# Patient Record
Sex: Male | Born: 1975 | Race: Black or African American | Hispanic: No | Marital: Married | Smoking: Never smoker
Health system: Southern US, Community
[De-identification: ages and names within clinical notes are randomized; demographics above are authoritative.]

## PROBLEM LIST (undated history)

## (undated) HISTORY — PX: FOOT SURGERY: SHX648

---

## 2013-12-14 ENCOUNTER — Observation Stay: Payer: Self-pay | Admitting: Podiatry

## 2013-12-14 LAB — BASIC METABOLIC PANEL
Anion Gap: 9 (ref 7–16)
BUN: 15 mg/dL (ref 7–18)
CALCIUM: 8.8 mg/dL (ref 8.5–10.1)
CO2: 28 mmol/L (ref 21–32)
Chloride: 104 mmol/L (ref 98–107)
Creatinine: 1.19 mg/dL (ref 0.60–1.30)
EGFR (African American): >60
EGFR (Non-African Amer.): >60
GLUCOSE: 105 mg/dL — AB (ref 65–99)
Osmolality: 282 (ref 275–301)
POTASSIUM: 3.5 mmol/L (ref 3.5–5.1)
SODIUM: 141 mmol/L (ref 136–145)

## 2013-12-14 LAB — CBC
HCT: 43.1 % (ref 40.0–52.0)
HGB: 14.2 g/dL (ref 13.0–18.0)
MCH: 30.2 pg (ref 26.0–34.0)
MCHC: 32.9 g/dL (ref 32.0–36.0)
MCV: 92 fL (ref 80–100)
Platelet: 261 10*3/uL (ref 150–440)
RBC: 4.69 10*6/uL (ref 4.40–5.90)
RDW: 14.1 % (ref 11.5–14.5)
WBC: 5.8 10*3/uL (ref 3.8–10.6)

## 2014-05-02 NOTE — Op Note (Signed)
PATIENT NAME:  Albert Bruce, Albert Bruce MR#:  [id number] DATE OF BIRTH:  20-Apr-1975  DATE OF PROCEDURE:  12/14/2013  PREOPERATIVE DIAGNOSES:  1.  Open fracture, left great toe, with traumatic amputation.  2.  Open fracture, left 2nd toe, from lawnmower injury.   PROCEDURE:  1.  Amputation of residual left great toe.  2.  Open repair of fracture, left second toe.   SURGEON: Adlean Hardeman A. Ether GriffinsFowler, DPM.   ANESTHESIA: General with local.   HEMOSTASIS: Ankle tourniquet inflated to 250 mmHg for 21 minutes.   COMPLICATIONS: None.   SPECIMEN: Bone from proximal phalanx open fracture left great toe.   OPERATIVE INDICATIONS: This is a 39 year old gentleman who sustained a lawnmower injury to his left foot earlier today. Was seen in the ER and had a traumatic amputation of his left great toe at the interphalangeal joint with severe exposure of bone as well as contamination to the local wound. A guillotine type of amputation had been performed to the great toe and a large laceration of the medial one half of the 2nd toe from the distal phalanx to the proximal phalanx had occurred as well. There was fracturing of the distal phalanx on x-ray. We discussed debridement of this wound with likely removal of bone to assist with wound closure of the left great toe. He has consented to this.   OPERATIVE PROCEDURE: The patient was brought into the OR and placed on the operating table in the supine position. General intubation was administered by the anesthesia team. The left lower extremity was then prepped and draped in the usual sterile fashion. Attention was directed to the left foot where the guillotine type of amputation had been performed. The laceration and amputation occurred from a proximal dorsal to distal plantar type of angulation. The entire distal phalanx had been amputated as well as no cartilage remaining of the proximal phalanx. This was amputated in approximately a 45 degree angle with exposure of the  medullary canal at this time. At first, I debrided the surrounding skin edges with the VersaJet to remove all contamination. The bone was also debrided circumferential around the wound site. At this time, the remainder of the proximal phalanx with the exception of the very base was then removed with a power saw and removed from the surgical field. I was able to perform a further debridement of the skin edges. The edges were coated with methylene blue and all of the wound was debrided with the VersaJet. This was then pulsed lavaged in this region. I was able to create a plantar skin flap to bring up dorsally for later reanastomosis loosely.   The 2nd toe laceration was noted. The laceration occurred just proximal to the 2nd toe nail with exposure of the interphalangeal joint of the distal portion of the toe. This wound was then debrided initially with the VersaJet as well as further with the pulse lavage. There was noted to be a 0.5 cm defect with mild exposure of bone in this region, a 2nd laceration just proximal to this coursing along the head of the proximal phalanx to the middle phalanx region medially. I was able to debride this out with the VersaJet as well as a pulsed lavage. The skin edge was easily coapted and this was loosely reapproximated with two 4-0 nylon sutures. The dorsal portion of the 2nd toe was loosely sutured at the distal aspect to cover the distal phalanx region. There was exposure of the IPJ and middle phalanx where I could  not get skin closure in this area. This will be allowed for further granulation with time. The great toe flap was then reapproximated very loosely dorsally. A 4-0 nylon suture was used. There was gapping of the dorsal portion of the skin flap to allow for drainage to occur. The skin flap was well perfused as the tourniquet was dropped prior to closure. All bleeders were Bovie cauterized at that time. A bulky compressive sterile dressing was loosely fitted around his  left foot. He was taken from the OR to the PACU with all vital signs stable and neurovascular status intact. He will be admitted to the floor for 24 hours of antibiotics. We will monitor him closely. Hopefully, can discharge in the next 24 hours. We will closely monitor him in the outpatient clinic as well.    ____________________________ Argentina Donovan. Ether Griffins, DPM jaf:LT D: 12/14/2013 20:33:02 ET T: 12/14/2013 20:53:39 ET JOB#: [id number]  cc: Jill Alexanders A. Ether Griffins, DPM, <Dictator> Montario Zilka DPM ELECTRONICALLY SIGNED 12/25/2013 13:07

## 2014-05-02 NOTE — Discharge Summary (Signed)
Dates of Admission and Diagnosis:  Date of Admission 14-Dec-2013   Date of Discharge 15-Dec-2013   Admitting Diagnosis Open fracture left great toe and 2nd toe   Final Diagnosis Open fracture left great toe and 2nd toe    Chief Complaint/History of Present Illness Pt seen in ER with open fracture and amputation of left great toe and fractured 2nd toe from lawnmower accident.   Allergies:  No Known Allergies:   Routine Chem:  06-Dec-15 16:03   Glucose, Serum  105  BUN 15  Creatinine (comp) 1.19  Sodium, Serum 141  Potassium, Serum 3.5  Chloride, Serum 104  CO2, Serum 28  Calcium (Total), Serum 8.8  Anion Gap 9  Osmolality (calc) 282  eGFR (African American) >60  eGFR (Non-African American) >60 (eGFR values <62m/min/1.73 m2 may be an indication of chronic kidney disease (CKD). Calculated eGFR, using the MRDR Study equation, is useful in  patients with stable renal function. The eGFR calculation will not be reliable in acutely ill patients when serum creatinine is changing rapidly. It is not useful in patients on dialysis. The eGFR calculation may not be applicable to patients at the low and high extremes of body sizes, pregnant women, and vegetarians.)  Routine Hem:  06-Dec-15 16:03   WBC (CBC) 5.8  RBC (CBC) 4.69  Hemoglobin (CBC) 14.2  Hematocrit (CBC) 43.1  Platelet Count (CBC) 261 (Result(s) reported on 14 Dec 2013 at 05:22PM.)  MCV 92  MCH 30.2  MCHC 32.9  RDW 14.1   Pertinent Past History:  Pertinent Past History Pt denies   Hospital Course:  Hospital Course Underwent debridement and partial great toe amputation and debridement and stabilization of 2nd toe fracture.  On ancef and tobramycin in hospital.  Doing well pod #1.  Afebrile   Condition on Discharge Good   DISCHARGE INSTRUCTIONS HOME MEDS:  Medication Reconciliation: Patient's Home Medications at Discharge:     Medication Instructions  acetaminophen-oxycodone 325 mg-5 mg oral tablet  1  tab(s) orally every 4 to 6 hours, As needed, moderate pain (4-6/10)   keflex 500 mg oral capsule  1 cap(s) orally 4 times a day     Physician's Instructions:  Home Health? No   Diet Regular   Activity Limitations No weight to left foot.  OK for heel touchdown.   Return to Work Not Applicable   Time frame for Follow Up Appointment 1-2 days   Electronic Signatures: FSamara Deist(MD)  (Signed 07-Dec-15 17:49)  Authored: ADMISSION DATE AND DIAGNOSIS, CHIEF COMPLAINT/HPI, Allergies, PERTINENT LABS, PERTINENT PAST HISTORY, HOSPITAL COURSE, DISCHARGE INSTRUCTIONS HOME MEDS, PATIENT INSTRUCTIONS   Last Updated: 07-Dec-15 17:49 by FSamara Deist(MD)

## 2014-05-04 LAB — SURGICAL PATHOLOGY

## 2015-01-04 ENCOUNTER — Emergency Department
Admission: EM | Admit: 2015-01-04 | Discharge: 2015-01-04 | Disposition: A | Discharge status: Home / Self Care | Payer: Federal, State, Local not specified - PPO | Attending: Emergency Medicine | Admitting: Emergency Medicine

## 2015-01-04 ENCOUNTER — Encounter: Payer: Self-pay | Admitting: Emergency Medicine

## 2015-01-04 DIAGNOSIS — H6091 Unspecified otitis externa, right ear: Secondary | ICD-10-CM | POA: Diagnosis not present

## 2015-01-04 DIAGNOSIS — K029 Dental caries, unspecified: Secondary | ICD-10-CM

## 2015-01-04 DIAGNOSIS — K0889 Other specified disorders of teeth and supporting structures: Secondary | ICD-10-CM | POA: Diagnosis not present

## 2015-01-04 DIAGNOSIS — R030 Elevated blood-pressure reading, without diagnosis of hypertension: Secondary | ICD-10-CM | POA: Insufficient documentation

## 2015-01-04 MED ORDER — NEOMYCIN-POLYMYXIN-HC 3.5-10000-1 OT SOLN: 3.0000 [drp] | Freq: Three times a day (TID) | OTIC | Status: AC

## 2015-01-04 MED ORDER — TRAMADOL HCL 50 MG PO TABS: 50.0000 mg | ORAL_TABLET | Freq: Four times a day (QID) | ORAL | Status: AC | PRN

## 2015-01-04 MED ORDER — AMOXICILLIN 500 MG PO CAPS: 500.0000 mg | ORAL_CAPSULE | Freq: Three times a day (TID) | ORAL | Status: AC

## 2015-01-04 MED ORDER — IBUPROFEN 800 MG PO TABS: 800.0000 mg | ORAL_TABLET | Freq: Three times a day (TID) | ORAL | Status: AC | PRN

## 2015-01-04 NOTE — ED Notes (Signed)
Right ear and tooth pain that began a few days ago.

## 2015-01-04 NOTE — Discharge Instructions (Signed)
Dental Caries Dental caries is tooth decay. This decay can cause a hole in teeth (cavity) that can get bigger and deeper over time. HOME CARE  Brush and floss your teeth. Do this at least two times a day.  Use a fluoride toothpaste.  Use a mouth rinse if told by your dentist or doctor.  Eat less sugary and starchy foods. Drink less sugary drinks.  Avoid snacking often on sugary and starchy foods. Avoid sipping often on sugary drinks.  Keep regular checkups and cleanings with your dentist.  Use fluoride supplements if told by your dentist or doctor.  Allow fluoride to be applied to teeth if told by your dentist or doctor.  OPTIONS FOR DENTAL FOLLOW UP CARE  [place] [facility] and [facility] Services - Local Safety Net Dental Clinics TripDoors.com.htm   [facility] [phone])  [facility] [phone])  [place] [phone])  [facility] [phone])  [facility] [phone]) This clinic caters to the indigent population and is on a lottery system. Location: [employer] [facility], [employer], [address] [place] Clinic Hours: Wednesdays from 6pm - 9pm, patients seen by a lottery system. For dates, call or go to [email or web address] Services: Cleanings, fillings and simple extractions. Payment Options: DENTAL WORK IS FREE OF CHARGE. Bring proof of income or support. Best way to get seen: Arrive at 5:15 pm - this is a lottery, NOT first come/first serve, so arriving earlier will not increase your chances of being seen.     [facility] [phone] Select option 1 for emergencies   Location: [facility], [place], [address] [place] Clinic Hours: No walk-ins accepted - call the day before to schedule an appointment. Check in times are 9:30 am and 1:30  pm. Services: Simple extractions, temporary fillings, pulpectomy/pulp debridement, uncomplicated abscess drainage. Payment Options: PAYMENT IS DUE AT THE TIME OF SERVICE.  Fee is usually $100-200, additional surgical procedures (e.g. abscess drainage) may be extra. Cash, checks, Visa/MasterCard accepted.  Can file Medicaid if patient is covered for dental - patient should call case worker to check. No discount for [facility] patients. Best way to get seen: MUST call the day before and get onto the schedule. Can usually be seen the next 1-2 days. No walk-ins accepted.     [facility] [phone]   Location: [facility], [address], [place] Clinic Hours: M, W, Th, F 8am or 1:30pm, Tues 9a or 1:30 - first come/first served. Services: Simple extractions, temporary fillings, uncomplicated abscess drainage.  You do not need to be an [facility] resident. Payment Options: PAYMENT IS DUE AT THE TIME OF SERVICE. Dental insurance, otherwise sliding scale - bring proof of income or support. Depending on income and treatment needed, cost is usually $50-200. Best way to get seen: Arrive early as it is first come/first served.     [facility] [phone]   Location: [address] Clinic Hours: Mon-Thu 8a-5p Services: Most basic dental services including extractions and fillings. Payment Options: PAYMENT IS DUE AT THE TIME OF SERVICE. Sliding scale, up to 50% off - bring proof if income or support. Medicaid with dental option accepted. Best way to get seen: Call to schedule an appointment, can usually be seen within 2 weeks OR they will try to see walk-ins - show up at 8a or 2p (you may have to wait).  [facility] [phone] [place] RESIDENTS ONLY   Location: [facility], [address], [place], [place] [address] Clinic Hours: By appointment  only. Monday - Thursday 8am-5pm, Friday 8am-12pm Services: Cleanings, fillings, extractions. Payment Options: PAYMENT IS DUE AT THE TIME OF SERVICE. Cash, Visa or MasterCard. Sliding scale - $30 minimum per service. Best way to get seen: Come in to office, complete packet and make an appointment - need proof of income or support monies for each household member and proof of [place] residence. Usually takes about a month to get in.     [facility] [phone]   Location: [address] [facility] Hours: Walk-in Urgent Care Dental Services are offered Monday-Friday mornings only. The numbers of emergencies accepted daily is limited to the number of providers available. Maximum 15 - Mondays, Wednesdays & Thursdays Maximum 10 - Tuesdays & Fridays Services: You do not need to be a [place] resident to be seen for a dental emergency. Emergencies are defined as pain, swelling, abnormal bleeding, or dental trauma. Walkins will receive x-rays if needed. NOTE: Dental cleaning is not an emergency. Payment Options: PAYMENT IS DUE AT THE TIME OF SERVICE. Minimum co-pay is $40.00 for uninsured patients. Minimum co-pay is $3.00 for Medicaid with dental coverage. Dental Insurance is accepted and must be presented at time of visit. Medicare does not cover dental. Forms of payment: Cash, credit card, checks. Best way to get seen: If not previously registered with the clinic, walk-in dental registration begins at 7:15 am and is on a first come/first serve basis. If previously registered with the clinic, call to make an appointment.     [facility] [phone] [place] RESIDENTS ONLY   Location: [address], [place], [place] Clinic Hours: Mon-Thu 10a-2p Services: Extractions only! Payment Options: FREE (donations accepted) - bring proof of income or support Best way to get seen: Call and schedule an appointment OR come  at 8am on the 1st Monday of every month (except for holidays) when it is first come/first served.     [facility] [phone]   Location: [address], [place] Clinic Hours: Friday mornings Services, Payment Options, Best way to get seen: Call for info  This information is not intended to replace advice given to you by your health care provider. Make sure you discuss any questions you have with your health care provider.   Document Released: 10/05/2007 Document Revised: 01/16/2014 Document Reviewed: 12/29/2011 Elsevier Interactive Patient Education [employer]2016 Elsevier Inc.

## 2015-01-04 NOTE — ED Provider Notes (Signed)
[facility] Emergency Department Provider Note  ____________________________________________  Time seen: Approximately 9:51 AM  I have reviewed the triage vital signs and the nursing notes.   HISTORY  Chief Complaint Dental Pain and Otalgia    HPI Albert Bruce is a 39 y.o. male patient complaining of right ear and tooth pain for several days. Patient state he saw his dentist approximately 2 weeks ago and was scheduled to see him again for 1 month. Patient states nose swelling around the tooth that the dentist worked on and was prescribed NSAIDs telephonically.  Patient state he noticed he is having decreased hearing and pain to the right ear . No palliative measures for this complaint. Patient rates his pain as 8/10. Describes dental pain is sharp and his ear pain is achy. History reviewed. No pertinent past medical history.  There are no active problems to display for this patient.   Past Surgical History  Procedure Laterality Date  . Foot surgery      No current outpatient prescriptions on file.  Allergies Review of patient's allergies indicates no known allergies.  No family history on file.  Social History Social History  Substance Use Topics  . Smoking status: Never Smoker   . Smokeless tobacco: None  . Alcohol Use: No    Review of Systems Constitutional: No fever/chills Eyes: No visual changes. ENT: No sore throat. Dental and ear pain. Cardiovascular: Denies chest pain. Respiratory: Denies shortness of breath. Gastrointestinal: No abdominal pain.  No nausea, no vomiting.  No diarrhea.  No constipation. Genitourinary: Negative for dysuria. Musculoskeletal: Negative for back pain. Skin: Negative for rash. Neurological: Negative for headaches, focal weakness or numbness. 10-point ROS otherwise negative.  ____________________________________________   PHYSICAL EXAM:  VITAL SIGNS: ED Triage Vitals  Enc Vitals Group     BP  01/04/15 0915 162/96 mmHg     Pulse Rate 01/04/15 0915 64     Resp 01/04/15 0915 18     Temp 01/04/15 0915 98.7 F (37.1 C)     Temp Source 01/04/15 0915 Oral     SpO2 01/04/15 0915 97 %     Weight 01/04/15 0915 256 lb (116.121 kg)     Height 01/04/15 0915 6' (1.829 m)     Head Cir --      Peak Flow --      Pain Score 01/04/15 0919 8     Pain Loc --      Pain Edu? --      Excl. in GC? --     Constitutional: Alert and oriented. Well appearing and in no acute distress. Eyes: Conjunctivae are normal. PERRL. EOMI. Head: Atraumatic. Nose: No congestion/rhinnorhea. Mouth/Throat: Mucous membranes are moist.  Oropharynx non-erythematous. Edematous gingiva right lower molar areas.  EARS: Edematous and erythematous right ear canal. Left ear unremarkable. Neck: No stridor.  No cervical spine tenderness to palpation. Hematological/Lymphatic/Immunilogical: No cervical lymphadenopathy. Cardiovascular: Normal rate, regular rhythm. Grossly normal heart sounds.  Good peripheral circulation. Elevated blood pressure Respiratory: Normal respiratory effort.  No retractions. Lungs CTAB. Gastrointestinal: Soft and nontender. No distention. No abdominal bruits. No CVA tenderness. Musculoskeletal: No lower extremity tenderness nor edema.  No joint effusions. Neurologic:  Normal speech and language. No gross focal neurologic deficits are appreciated. No gait instability. Skin:  Skin is warm, dry and intact. No rash noted. Psychiatric: Mood and affect are normal. Speech and behavior are normal.  ____________________________________________   LABS (all labs ordered are listed, but only abnormal results are  displayed)  Labs Reviewed - No data to display ____________________________________________  EKG   ____________________________________________  RADIOLOGY   ____________________________________________   PROCEDURES  Procedure(s) performed: None  Critical Care performed:  No  ____________________________________________   INITIAL IMPRESSION / ASSESSMENT AND PLAN / ED COURSE  Pertinent labs & imaging results that were available during my care of the patient were reviewed by me and considered in my medical decision making (see chart for details).  Dental pain and right otitis external.. Patient given a prescription for amoxicillin, tramadol, ibuprofen and Cortisporin eardrops. Patient advised to follow with dentist continual care. ____________________________________________   FINAL CLINICAL IMPRESSION(S) / ED DIAGNOSES  Final diagnoses:  Pain due to dental caries  Otitis external, right      Joni Reining, PA-C 01/04/15 1003  Jene Every, MD 01/04/15 1500

## 2015-10-26 IMAGING — CR LEFT GREAT TOE
1 series · 3 of 3 positions shown · non-contrast
Comparison: None

CLINICAL DATA: Amputation LEFT great toe and laceration second toe
with lawnmower today

EXAM:
LEFT GREAT TOE

[Series 1: dxr toe great (1st digit) lt foo · 0.14mm/px · 3 of 3 slices shown]
[im 1/3]
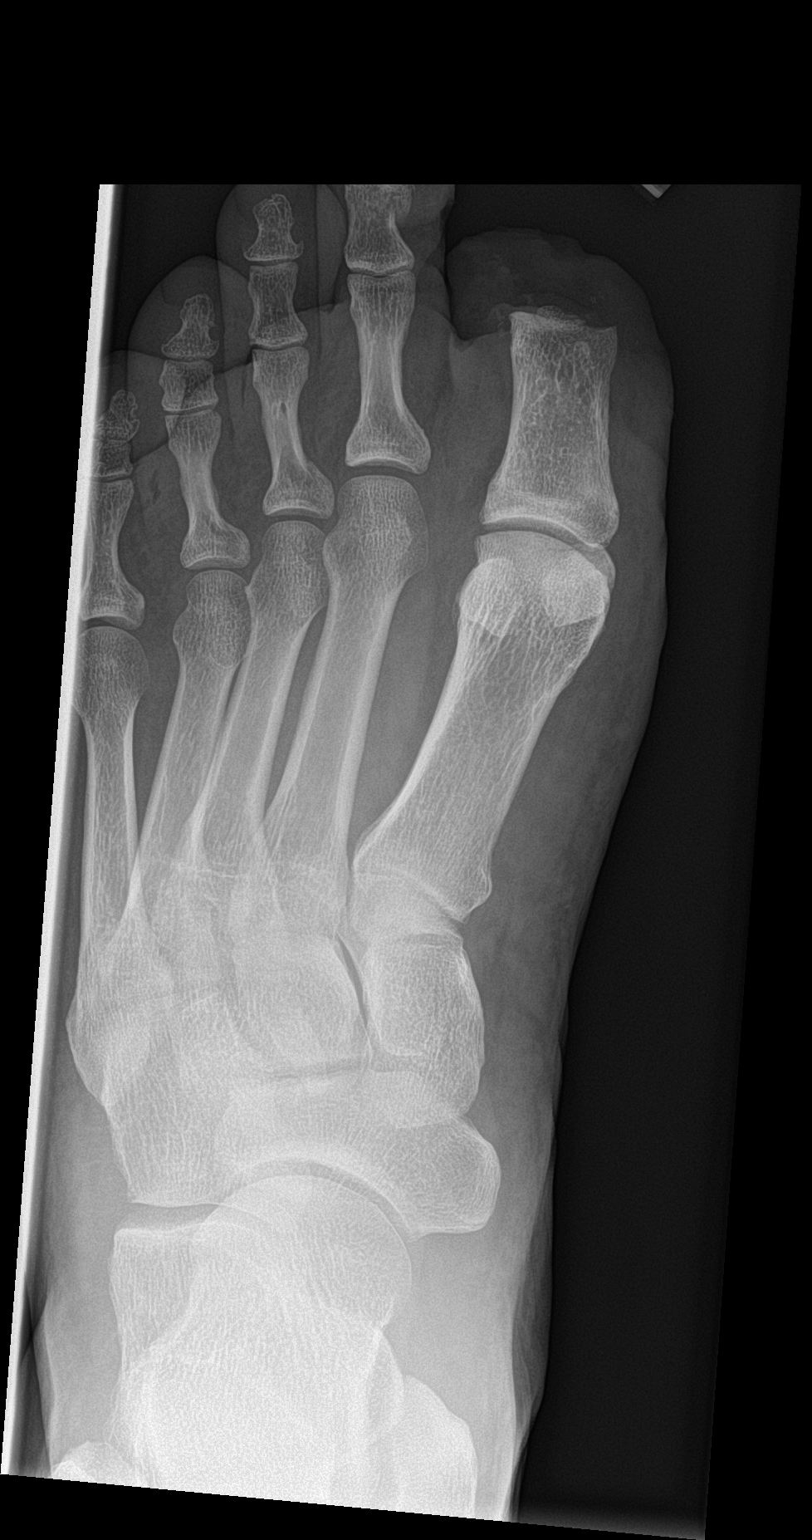
[im 2/3]
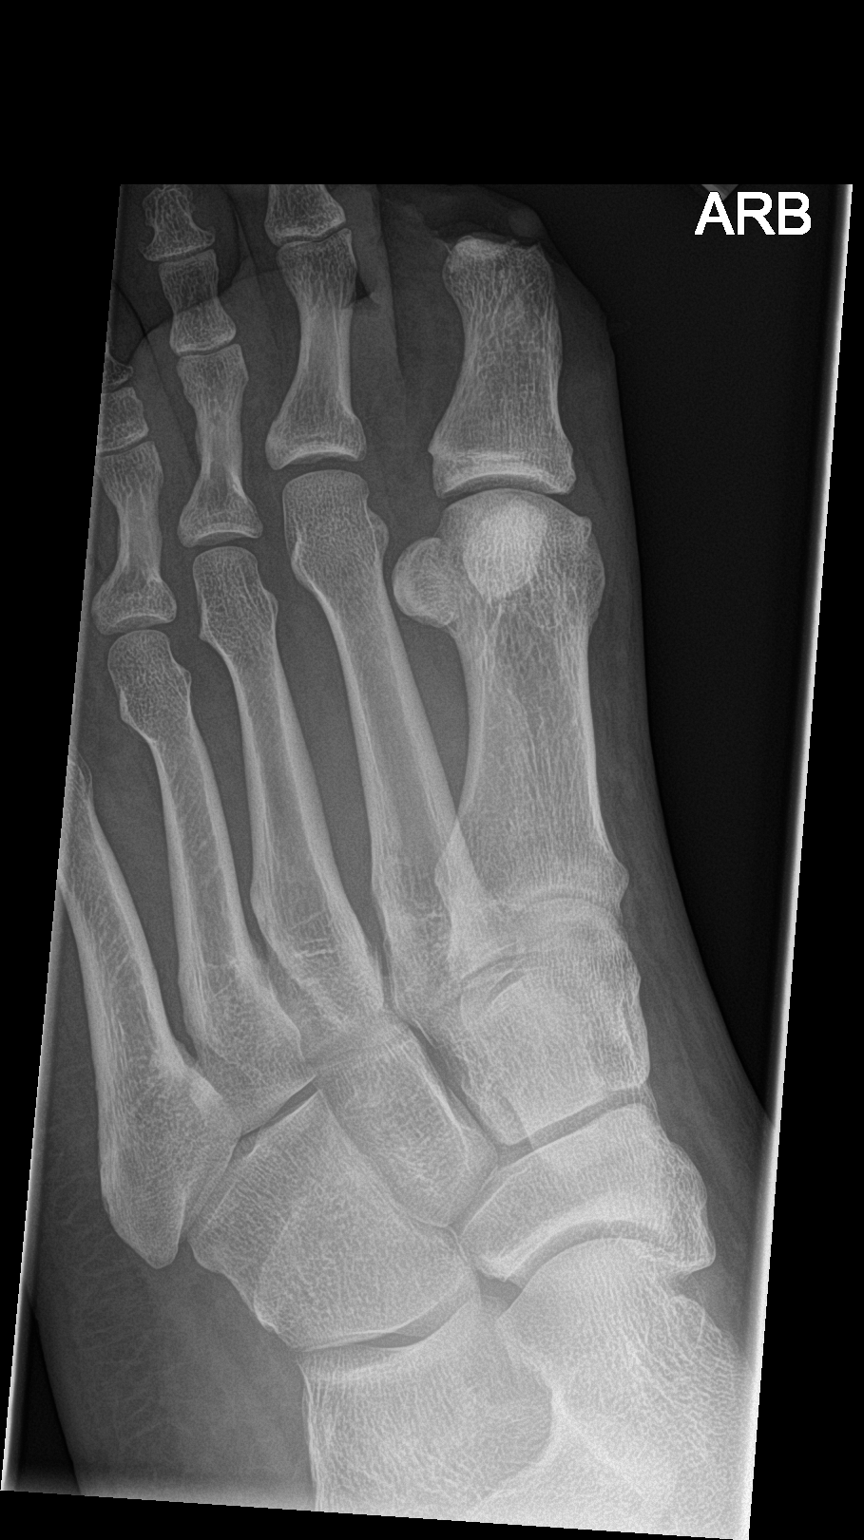
[im 3/3]
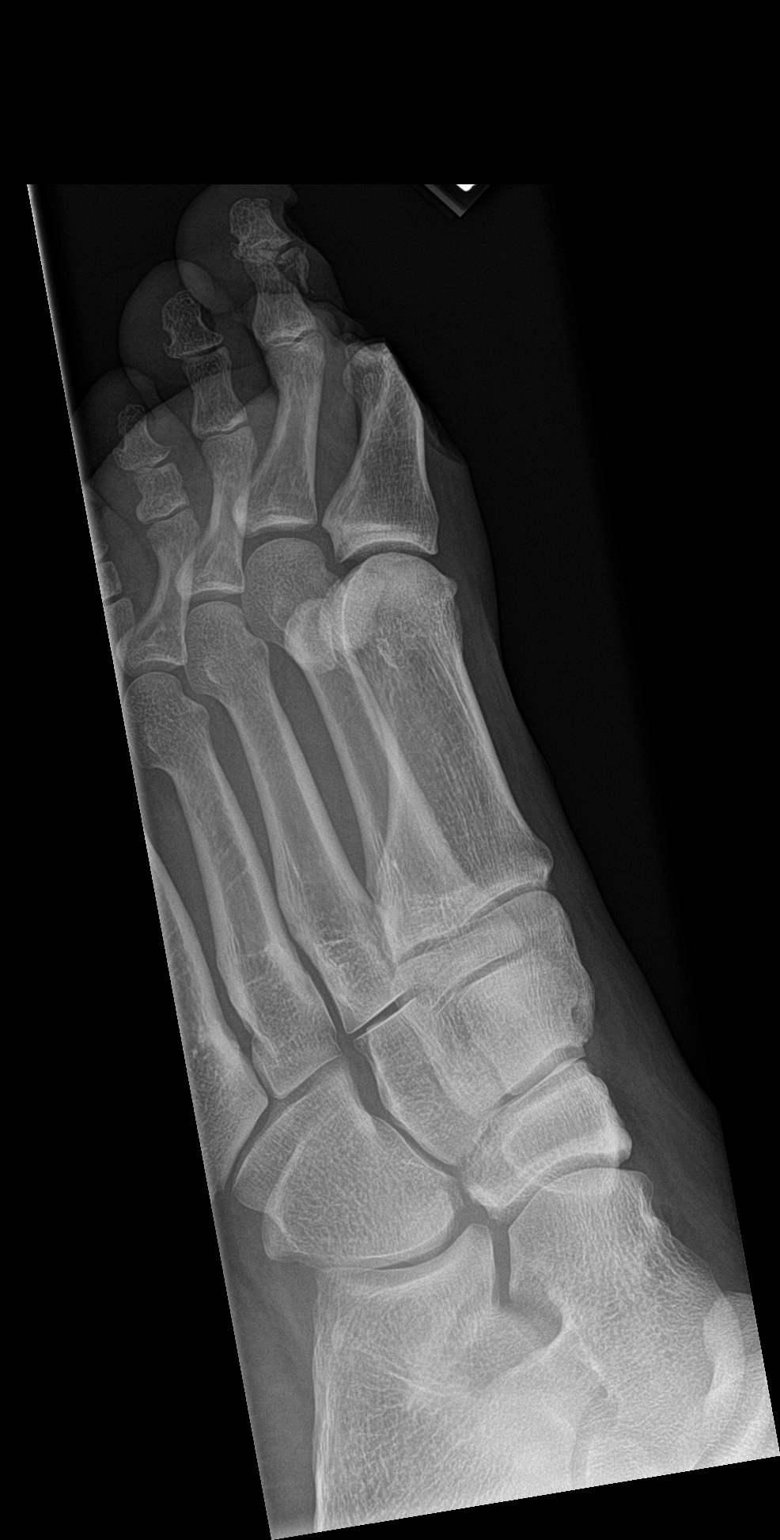

[3 of 3 positions shown; findings below may reference images not displayed]

FINDINGS: Amputation of LEFT great toe through very distal aspect of the
proximal phalanx LEFT great toe.

Loss of the articular surface at the the medial aspect of the head
of the proximal phalanx is noted with minimal debris at amputation
stump.

Laceration at medial aspect LEFT second toe of level of distal
phalanx with displaced fracture involving the distal aspect of the
middle phalanx, intra-articular at DIP joint.

No additional fracture or dislocation identified.

Osseous mineralization normal
IMPRESSION: Amputation of LEFT great toe through the very distal aspect of the
proximal phalanx.

Displaced comminuted and intra-articular fracture through the head
of the middle phalanx LEFT second toe.
